# Patient Record
Sex: Male | Born: 1985 | Race: White | Hispanic: No | Marital: Single | Smoking: Former smoker
Health system: Southern US, Community
[De-identification: ages and names within clinical notes are randomized; demographics above are authoritative.]

## PROBLEM LIST (undated history)

## (undated) DIAGNOSIS — F845 Asperger's syndrome: Secondary | ICD-10-CM

## (undated) DIAGNOSIS — I519 Heart disease, unspecified: Secondary | ICD-10-CM

## (undated) DIAGNOSIS — I10 Essential (primary) hypertension: Secondary | ICD-10-CM

## (undated) DIAGNOSIS — E119 Type 2 diabetes mellitus without complications: Secondary | ICD-10-CM

## (undated) HISTORY — PX: CHOLECYSTECTOMY: SHX55

---

## 2014-06-22 ENCOUNTER — Encounter (HOSPITAL_COMMUNITY): Payer: Self-pay | Admitting: Emergency Medicine

## 2014-06-22 ENCOUNTER — Emergency Department (HOSPITAL_COMMUNITY)
Admission: EM | Admit: 2014-06-22 | Discharge: 2014-06-23 | Disposition: A | Discharge status: Home / Self Care | Payer: Medicaid Other | Attending: Emergency Medicine | Admitting: Emergency Medicine

## 2014-06-22 ENCOUNTER — Emergency Department (HOSPITAL_COMMUNITY): Payer: Medicaid Other

## 2014-06-22 DIAGNOSIS — Z720 Tobacco use: Secondary | ICD-10-CM | POA: Insufficient documentation

## 2014-06-22 DIAGNOSIS — Z792 Long term (current) use of antibiotics: Secondary | ICD-10-CM | POA: Diagnosis not present

## 2014-06-22 DIAGNOSIS — F840 Autistic disorder: Secondary | ICD-10-CM | POA: Insufficient documentation

## 2014-06-22 DIAGNOSIS — I10 Essential (primary) hypertension: Secondary | ICD-10-CM | POA: Diagnosis not present

## 2014-06-22 DIAGNOSIS — Z7982 Long term (current) use of aspirin: Secondary | ICD-10-CM | POA: Diagnosis not present

## 2014-06-22 DIAGNOSIS — Z794 Long term (current) use of insulin: Secondary | ICD-10-CM | POA: Diagnosis not present

## 2014-06-22 DIAGNOSIS — N179 Acute kidney failure, unspecified: Secondary | ICD-10-CM

## 2014-06-22 DIAGNOSIS — R079 Chest pain, unspecified: Secondary | ICD-10-CM | POA: Diagnosis not present

## 2014-06-22 DIAGNOSIS — E119 Type 2 diabetes mellitus without complications: Secondary | ICD-10-CM | POA: Diagnosis not present

## 2014-06-22 DIAGNOSIS — R000 Tachycardia, unspecified: Secondary | ICD-10-CM | POA: Diagnosis not present

## 2014-06-22 DIAGNOSIS — R202 Paresthesia of skin: Secondary | ICD-10-CM | POA: Diagnosis not present

## 2014-06-22 DIAGNOSIS — Z79899 Other long term (current) drug therapy: Secondary | ICD-10-CM | POA: Diagnosis not present

## 2014-06-22 DIAGNOSIS — I519 Heart disease, unspecified: Secondary | ICD-10-CM | POA: Diagnosis not present

## 2014-06-22 HISTORY — DX: Essential (primary) hypertension: I10

## 2014-06-22 HISTORY — DX: Asperger's syndrome: F84.5

## 2014-06-22 HISTORY — DX: Type 2 diabetes mellitus without complications: E11.9

## 2014-06-22 HISTORY — DX: Heart disease, unspecified: I51.9

## 2014-06-22 LAB — CBC
HEMATOCRIT: 35.8 % — AB (ref 39.0–52.0)
Hemoglobin: 12.5 g/dL — ABNORMAL LOW (ref 13.0–17.0)
MCH: 30 pg (ref 26.0–34.0)
MCHC: 34.9 g/dL (ref 30.0–36.0)
MCV: 86.1 fL (ref 78.0–100.0)
Platelets: 202 10*3/uL (ref 150–400)
RBC: 4.16 MIL/uL — AB (ref 4.22–5.81)
RDW: 13.4 % (ref 11.5–15.5)
WBC: 8 10*3/uL (ref 4.0–10.5)

## 2014-06-22 MED ORDER — SODIUM CHLORIDE 0.9 % IV BOLUS (SEPSIS)
1000.0000 mL | Freq: Once | INTRAVENOUS | Status: AC
  Administered 2014-06-22: 1000 mL via INTRAVENOUS

## 2014-06-22 MED ORDER — KETOROLAC TROMETHAMINE 30 MG/ML IJ SOLN
30.0000 mg | Freq: Once | INTRAMUSCULAR | Status: AC
  Administered 2014-06-22: 30 mg via INTRAVENOUS
  Filled 2014-06-22: qty 1

## 2014-06-22 NOTE — ED Provider Notes (Signed)
CSN: [id number]     Arrival date & time 06/22/14  2200 History   First MD Initiated Contact with Patient 06/22/14 2249     Chief Complaint  Patient presents with  . multiple complaints      (Consider location/radiation/quality/duration/timing/severity/associated sxs/prior Treatment) HPI Comments: The patient is a 28 year old male with a history of hypertension, diabetes and a recent cellulitis of the left side of his neck after a spider bite. He presents stating that for the last 2 weeks he has had some pain and tingling to his diffuse body specifically his legs, this is bilateral, very nonspecific and the patient does have a form of autism which makes it difficult for him to explain his symptoms. He reports that today he developed chest pain in addition to his diffuse body discomfort. He states that he may have some shortness of nausea vomiting diarrhea abdominal pain back pain headache swelling rashes. He has had no focal weakness or numbness, no visual changes, no sore throat. He does not have any risk factors for pulmonary embolism. He has been taking his medications as indicated, no new medications, no over-the-counter medications, the only new exposure with medications is the antibiotics which she has been taking without difficulty.  The history is provided by the patient and a friend.    Past Medical History  Diagnosis Date  . Hypertension   . Heart disease   . Diabetes mellitus without complication   . Asperger's syndrome    Past Surgical History  Procedure Laterality Date  . Cholecystectomy     No family history on file. History  Substance Use Topics  . Smoking status: Current Some Day Smoker  . Smokeless tobacco: Not on file  . Alcohol Use: No    Review of Systems  All other systems reviewed and are negative.     Allergies  Review of patient's allergies indicates no known allergies.  Home Medications   Prior to Admission medications   Medication Sig Start Date  End Date Taking? Authorizing Provider  aspirin EC 325 MG tablet Take 325 mg by mouth daily.   Yes Historical Provider, MD  cephALEXin (KEFLEX) 500 MG capsule Take 500 mg by mouth 4 (four) times daily.   Yes Historical Provider, MD  cetirizine (ZYRTEC) 10 MG tablet Take 10 mg by mouth daily.   Yes Historical Provider, MD  colestipol (COLESTID) 1 G tablet Take 1 g by mouth 3 (three) times daily.   Yes Historical Provider, MD  ezetimibe (ZETIA) 10 MG tablet Take 10 mg by mouth daily.   Yes Historical Provider, MD  insulin lispro (HUMALOG) 100 UNIT/ML injection Inject 30-40 Units into the skin 3 (three) times daily before meals.   Yes Historical Provider, MD  lisinopril (PRINIVIL,ZESTRIL) 40 MG tablet Take 40 mg by mouth daily.   Yes Historical Provider, MD  sulfamethoxazole-trimethoprim (BACTRIM DS,SEPTRA DS) 800-160 MG per tablet Take 1 tablet by mouth 2 (two) times daily.   Yes Historical Provider, MD  traMADol (ULTRAM) 50 MG tablet Take 1 tablet (50 mg total) by mouth every 6 (six) hours as needed. 06/23/14   Vida Roller, MD   BP 151/89 mmHg  Pulse 90  Temp(Src) 97.4 F (36.3 C) (Axillary)  Resp 15  Ht 6\' 1"  (1.854 m)  Wt 268 lb (121.564 kg)  BMI 35.37 kg/m2  SpO2 99% Physical Exam  Constitutional: He appears well-developed and well-nourished. No distress.  HENT:  Head: Normocephalic and atraumatic.  Mouth/Throat: Oropharynx is clear and moist. No  oropharyngeal exudate.  Eyes: Conjunctivae and EOM are normal. Pupils are equal, round, and reactive to light. Right eye exhibits no discharge. Left eye exhibits no discharge. No scleral icterus.  Neck: Normal range of motion. Neck supple. No JVD present. No thyromegaly present.  Cardiovascular: Regular rhythm, normal heart sounds and intact distal pulses.  Exam reveals no gallop and no friction rub.   No murmur heard. 110  Pulmonary/Chest: Effort normal and breath sounds normal. No respiratory distress. He has no wheezes. He has no rales.   Abdominal: Soft. Bowel sounds are normal. He exhibits no distension and no mass. There is no tenderness.  Musculoskeletal: Normal range of motion. He exhibits no edema or tenderness.  Lymphadenopathy:    He has no cervical adenopathy.  Neurological: He is alert. Coordination normal.  Skin: Skin is warm and dry. No rash noted. No erythema.  Psychiatric: He has a normal mood and affect. His behavior is normal.  Nursing note and vitals reviewed.   ED Course  Procedures (including critical care time) Labs Review Labs Reviewed  CBC - Abnormal; Notable for the following:    RBC 4.16 (*)    Hemoglobin 12.5 (*)    HCT 35.8 (*)    All other components within normal limits  BASIC METABOLIC PANEL - Abnormal; Notable for the following:    Sodium 134 (*)    Glucose, Bld 143 (*)    BUN 35 (*)    Creatinine, Ser 1.97 (*)    GFR calc non Af Amer 45 (*)    GFR calc Af Amer 52 (*)    All other components within normal limits  URINALYSIS, ROUTINE W REFLEX MICROSCOPIC - Abnormal; Notable for the following:    APPearance CLOUDY (*)    Glucose, UA 250 (*)    Hgb urine dipstick MODERATE (*)    Protein, ur >300 (*)    All other components within normal limits  URINE RAPID DRUG SCREEN (HOSP PERFORMED) - Abnormal; Notable for the following:    Tetrahydrocannabinol POSITIVE (*)    All other components within normal limits  D-DIMER, QUANTITATIVE - Abnormal; Notable for the following:    D-Dimer, Quant 0.56 (*)    All other components within normal limits  TROPONIN I  URINE MICROSCOPIC-ADD ON  Rosezena SensorI-STAT TROPOININ, ED    Imaging Review Dg Chest 2 View  06/22/2014   CLINICAL DATA:  Acute onset of mid chest pain and body tingling. History of diabetes and smoking. Initial encounter.  EXAM: CHEST  2 VIEW  COMPARISON:  None.  FINDINGS: The lungs are well-aerated and clear. There is no evidence of focal opacification, pleural effusion or pneumothorax.  The heart is normal in size; the mediastinal contour  is within normal limits. No acute osseous abnormalities are seen. Clips are noted within the right upper quadrant, reflecting prior cholecystectomy.  IMPRESSION: No acute cardiopulmonary process seen.   Electronically Signed   By: Roanna RaiderJeffery  Chang M.D.   On: 06/22/2014 23:43     EKG Interpretation   Date/Time:  Saturday June 23 2014 06:20:22 EST Ventricular Rate:  102 PR Interval:  163 QRS Duration: 106 QT Interval:  366 QTC Calculation: 477 R Axis:   79 Text Interpretation:  Sinus tachycardia ST elev, probable normal early  repol pattern Borderline prolonged QT interval Baseline wander in lead(s)  V3 Since last tracing rate slower Confirmed by Lonzell Dorris  MD, Glennon Kopko ([id number])  on 06/23/2014 6:24:10 AM      ED ECG REPORT  I personally  interpreted this EKG   Date: 06/23/2014   Rate: 102  Rhythm: sinus tachycardia  QRS Axis: normal  Intervals: normal  ST/T Wave abnormalities: normal  Conduction Disutrbances:none  Narrative Interpretation:   Old EKG Reviewed: unchanged   MDM   Final diagnoses:  Chest pain  Acute renal failure, unspecified acute renal failure type    There is no peripheral edema or asymmetry of the legs, the patient appears comfortable  - he has normal VS other than tachycardia.  There is a low suspicion for pulmonary embolism, pericarditis is not seen on the EKG nor does he have a friction rub him and there is no murmur, no peripheral edema, no fever. We'll start with chest x-ray, labs  The pt has renal insufficiency, unsure if this is new, the pt is unaware of prior labs and there is no prior labs in EMR.  VS have improved, fluids given and pt encouraged to maximize PO intake - repeat ECG and trop neg, stable for d/c.  [place] and f/u there.  All findings were discussed with the patient and his friend, he has expressed his understanding to the need to follow-up especially for repeat creatinine testing. He has had fluid, oral fluid,  parenteral fluid and feels much better, no acute findings on labs to explain the patient's symptoms.    Meds given in ED:  Medications  sodium chloride 0.9 % bolus 1,000 mL (0 mLs Intravenous Stopped 06/23/14 0459)  ketorolac (TORADOL) 30 MG/ML injection 30 mg (30 mg Intravenous Given 06/22/14 2332)    New Prescriptions   TRAMADOL (ULTRAM) 50 MG TABLET    Take 1 tablet (50 mg total) by mouth every 6 (six) hours as needed.      Vida RollerBrian D Shabazz Mckey, MD 06/23/14 [phone]

## 2014-06-22 NOTE — ED Notes (Addendum)
Pt reports having tingling to entire body with upper chest pain and difficulty breathing intermittent x 2 weeks. Pt states he is unable to get medical care where he is from and moved up here with cousin. Pt does have Asperger's. Pt was bit by spider to L neck, is currently on antibiotics

## 2014-06-23 LAB — BASIC METABOLIC PANEL
Anion gap: 15 (ref 5–15)
BUN: 35 mg/dL — AB (ref 6–23)
CO2: 19 meq/L (ref 19–32)
Calcium: 9.4 mg/dL (ref 8.4–10.5)
Chloride: 100 mEq/L (ref 96–112)
Creatinine, Ser: 1.97 mg/dL — ABNORMAL HIGH (ref 0.50–1.35)
GFR calc Af Amer: 52 mL/min — ABNORMAL LOW (ref >90)
GFR calc non Af Amer: 45 mL/min — ABNORMAL LOW (ref >90)
Glucose, Bld: 143 mg/dL — ABNORMAL HIGH (ref 70–99)
Potassium: 4.9 mEq/L (ref 3.7–5.3)
Sodium: 134 mEq/L — ABNORMAL LOW (ref 137–147)

## 2014-06-23 LAB — RAPID URINE DRUG SCREEN, HOSP PERFORMED
AMPHETAMINES: NOT DETECTED
BARBITURATES: NOT DETECTED
BENZODIAZEPINES: NOT DETECTED
Cocaine: NOT DETECTED
Opiates: NOT DETECTED
Tetrahydrocannabinol: POSITIVE — AB

## 2014-06-23 LAB — URINALYSIS, ROUTINE W REFLEX MICROSCOPIC
BILIRUBIN URINE: NEGATIVE
GLUCOSE, UA: 250 mg/dL — AB
Ketones, ur: NEGATIVE mg/dL
Leukocytes, UA: NEGATIVE
NITRITE: NEGATIVE
PH: 5 (ref 5.0–8.0)
Protein, ur: >300 mg/dL — AB
SPECIFIC GRAVITY, URINE: 1.027 (ref 1.005–1.030)
Urobilinogen, UA: 0.2 mg/dL (ref 0.0–1.0)

## 2014-06-23 LAB — I-STAT TROPONIN, ED: TROPONIN I, POC: 0.01 ng/mL (ref 0.00–0.08)

## 2014-06-23 LAB — URINE MICROSCOPIC-ADD ON

## 2014-06-23 LAB — D-DIMER, QUANTITATIVE: D-Dimer, Quant: 0.56 ug/mL-FEU — ABNORMAL HIGH (ref 0.00–0.48)

## 2014-06-23 LAB — TROPONIN I: Troponin I: <0.3 ng/mL (ref <0.30)

## 2014-06-23 MED ORDER — TRAMADOL HCL 50 MG PO TABS: 50.0000 mg | ORAL_TABLET | Freq: Four times a day (QID) | ORAL | Status: DC | PRN

## 2014-06-23 NOTE — Discharge Instructions (Signed)
Your testing shows that you have "kidney failure". This is mild but it absolutely must be rechecked in the next couple of days to make sure that the tests are getting better.  When you see your doctor, you may tell them that your Creatinine was just under 2.0.  I will give you a copy of your results to take to your doctor with you.    Your heart tests have been totally normal - if you should develop severe or worsening pain, return to the ER.  Please call your doctor for a followup appointment within 24-48 hours. When you talk to your doctor please let them know that you were seen in the emergency department and have them acquire all of your records so that they can discuss the findings with you and formulate a treatment plan to fully care for your new and ongoing problems.

## 2014-06-25 ENCOUNTER — Encounter (HOSPITAL_COMMUNITY): Payer: Self-pay | Admitting: Emergency Medicine

## 2014-06-25 ENCOUNTER — Emergency Department (HOSPITAL_COMMUNITY)
Admission: EM | Admit: 2014-06-25 | Discharge: 2014-06-25 | Disposition: A | Discharge status: Home / Self Care | Payer: Medicaid Other | Attending: Emergency Medicine | Admitting: Emergency Medicine

## 2014-06-25 DIAGNOSIS — Z720 Tobacco use: Secondary | ICD-10-CM | POA: Diagnosis not present

## 2014-06-25 DIAGNOSIS — Z0000 Encounter for general adult medical examination without abnormal findings: Secondary | ICD-10-CM | POA: Diagnosis present

## 2014-06-25 DIAGNOSIS — I10 Essential (primary) hypertension: Secondary | ICD-10-CM | POA: Insufficient documentation

## 2014-06-25 DIAGNOSIS — R944 Abnormal results of kidney function studies: Secondary | ICD-10-CM | POA: Diagnosis not present

## 2014-06-25 DIAGNOSIS — F845 Asperger's syndrome: Secondary | ICD-10-CM | POA: Diagnosis not present

## 2014-06-25 DIAGNOSIS — Z87448 Personal history of other diseases of urinary system: Secondary | ICD-10-CM | POA: Insufficient documentation

## 2014-06-25 DIAGNOSIS — Z79899 Other long term (current) drug therapy: Secondary | ICD-10-CM | POA: Diagnosis not present

## 2014-06-25 DIAGNOSIS — Z794 Long term (current) use of insulin: Secondary | ICD-10-CM | POA: Insufficient documentation

## 2014-06-25 DIAGNOSIS — Z792 Long term (current) use of antibiotics: Secondary | ICD-10-CM | POA: Diagnosis not present

## 2014-06-25 DIAGNOSIS — N289 Disorder of kidney and ureter, unspecified: Secondary | ICD-10-CM

## 2014-06-25 DIAGNOSIS — E119 Type 2 diabetes mellitus without complications: Secondary | ICD-10-CM | POA: Diagnosis not present

## 2014-06-25 DIAGNOSIS — Z7982 Long term (current) use of aspirin: Secondary | ICD-10-CM | POA: Insufficient documentation

## 2014-06-25 DIAGNOSIS — Z8639 Personal history of other endocrine, nutritional and metabolic disease: Secondary | ICD-10-CM

## 2014-06-25 LAB — I-STAT CHEM 8, ED
BUN: 25 mg/dL — ABNORMAL HIGH (ref 6–23)
CALCIUM ION: 1.22 mmol/L (ref 1.12–1.23)
CHLORIDE: 107 meq/L (ref 96–112)
Creatinine, Ser: 1.6 mg/dL — ABNORMAL HIGH (ref 0.50–1.35)
Glucose, Bld: 165 mg/dL — ABNORMAL HIGH (ref 70–99)
HEMATOCRIT: 35 % — AB (ref 39.0–52.0)
Hemoglobin: 11.9 g/dL — ABNORMAL LOW (ref 13.0–17.0)
Potassium: 5.3 mEq/L (ref 3.7–5.3)
Sodium: 136 mEq/L — ABNORMAL LOW (ref 137–147)
TCO2: 21 mmol/L (ref 0–100)

## 2014-06-25 NOTE — ED Notes (Signed)
Pt was seen here Friday , creatinine was high and pt was told to recheck it within 3 days.

## 2014-06-25 NOTE — ED Provider Notes (Signed)
CSN: [id number]     Arrival date & time 06/25/14  1423 History   First MD Initiated Contact with Patient 06/25/14 1436     Chief Complaint  Patient presents with  . Labs Only     (Consider location/radiation/quality/duration/timing/severity/associated sxs/prior Treatment) HPI  Jonathan Baxter is a 28 y.o. male with PMH of hypertension, diabetes, Asperger's, anxiety who was evaluated here 3 days ago for nonspecific generalized body aches including chest pain. EKG, chest x-ray, troponins were negative. Patient has a form of autism and it makes it difficult for him to explain his symptoms. His caretaker is in the exam room with him and answers most questions. Patient's complaint today is recheck of his creatinine and kidney function. Pt with unexplained acute kidney injury 3 days ago without prior labs to compare to. Patient denies any chest pain, shortness of breath, nausea, vomiting, abdominal pain, headaches, back pain. No other complaints today other than anxiety being at the hospital. Patient unable to follow[place] in [place]. He has not had a provider here.   Past Medical History  Diagnosis Date  . Hypertension   . Heart disease   . Diabetes mellitus without complication   . Asperger's syndrome    Past Surgical History  Procedure Laterality Date  . Cholecystectomy     No family history on file. History  Substance Use Topics  . Smoking status: Current Some Day Smoker  . Smokeless tobacco: Not on file  . Alcohol Use: No    Review of Systems  Constitutional: Negative for fever and chills.  HENT: Negative for congestion and rhinorrhea.   Eyes: Negative for visual disturbance.  Respiratory: Negative for cough and shortness of breath.   Cardiovascular: Negative for chest pain and palpitations.  Gastrointestinal: Negative for nausea, vomiting and diarrhea.  Genitourinary: Negative for dysuria and hematuria.  Musculoskeletal: Negative for back pain and  gait problem.  Skin: Negative for rash.  Neurological: Negative for weakness and headaches.      Allergies  Review of patient's allergies indicates no known allergies.  Home Medications   Prior to Admission medications   Medication Sig Start Date End Date Taking? Authorizing Provider  aspirin EC 325 MG tablet Take 325 mg by mouth daily.    Historical Provider, MD  cephALEXin (KEFLEX) 500 MG capsule Take 500 mg by mouth 4 (four) times daily.    Historical Provider, MD  cetirizine (ZYRTEC) 10 MG tablet Take 10 mg by mouth daily.    Historical Provider, MD  colestipol (COLESTID) 1 G tablet Take 1 g by mouth 3 (three) times daily.    Historical Provider, MD  ezetimibe (ZETIA) 10 MG tablet Take 10 mg by mouth daily.    Historical Provider, MD  insulin lispro (HUMALOG) 100 UNIT/ML injection Inject 30-40 Units into the skin 3 (three) times daily before meals.    Historical Provider, MD  lisinopril (PRINIVIL,ZESTRIL) 40 MG tablet Take 40 mg by mouth daily.    Historical Provider, MD  sulfamethoxazole-trimethoprim (BACTRIM DS,SEPTRA DS) 800-160 MG per tablet Take 1 tablet by mouth 2 (two) times daily.    Historical Provider, MD  traMADol (ULTRAM) 50 MG tablet Take 1 tablet (50 mg total) by mouth every 6 (six) hours as needed. 06/23/14   Vida RollerBrian D Miller, MD   BP 133/73 mmHg  Pulse 116  Temp(Src) 98 F (36.7 C)  Resp 18  SpO2 100% Physical Exam  Constitutional: He appears well-developed and well-nourished. No distress.  HENT:  Head: Normocephalic and atraumatic.  Eyes: Conjunctivae and EOM are normal. Right eye exhibits no discharge. Left eye exhibits no discharge.  Cardiovascular: Normal rate, regular rhythm and normal heart sounds.   Pulmonary/Chest: Effort normal and breath sounds normal. No respiratory distress. He has no wheezes.  Abdominal: Soft. Bowel sounds are normal. He exhibits no distension. There is no tenderness.  Neurological: He is alert. He exhibits normal muscle tone.  Coordination normal.  Skin: Skin is warm and dry. He is not diaphoretic.  Nursing note and vitals reviewed.   ED Course  Procedures (including critical care time) Labs Review Labs Reviewed  I-STAT CHEM 8, ED - Abnormal; Notable for the following:    Sodium 136 (*)    BUN 25 (*)    Creatinine, Ser 1.60 (*)    Glucose, Bld 165 (*)    Hemoglobin 11.9 (*)    HCT 35.0 (*)    All other components within normal limits    Imaging Review No results found.   EKG Interpretation None      MDM   Final diagnoses:  Abnormal kidney function  History of diabetes mellitus, type II   Pt with history of acute kidney injury who presents today for recheck of kidney function and was unable to follow up with a PCP in the area due to [place]CP in [place] and new PCP not accepting his insurance without medicaid accepting PCP transfer. Repeat of kidney function shows improvement of creatine 1.60 from 1.9 and BUN 25 today from 35. Still unsure if this is pts baseline. Pt and caregiver provided with referral to nephrology for further evaluation of kidney function. If patient unable to follow up with nephrology in timely manner, pt to present to PCP or ED in one week for reevaluation of kidney function. Pt referred to the wellness center for PCP follow up. Pt without symptoms today except for anxiety and tachycardia which improved on repeat examination after pt had watched videos that care provider had brought with her. Pt with tachycardia on previous presentation and with negative d-dimer. Pt low risk for and I doubt PE.   Discussed return precautions with patient. Discussed all results and patient verbalizes understanding and agrees with plan.      Louann SjogrenVictoria L Dimarco Minkin, PA-C 06/25/14 2017  Elwin MochaBlair Walden, MD 06/26/14 [id number]

## 2014-06-25 NOTE — Discharge Instructions (Signed)
Return to the emergency room with worsening of symptoms, new symptoms or with symptoms that are concerning. Call to make appointment with nephrology. If you were unable to follow-up with nephrology please present back to the ED in 1 week for recheck of kidney function. [facility] to [facility] care with a primary care provider. Be sure to drink plenty of fluids.

## 2014-07-03 ENCOUNTER — Emergency Department (HOSPITAL_COMMUNITY): Payer: Medicaid Other

## 2014-07-03 ENCOUNTER — Encounter (HOSPITAL_COMMUNITY): Payer: Self-pay

## 2014-07-03 ENCOUNTER — Emergency Department (HOSPITAL_COMMUNITY)
Admission: EM | Admit: 2014-07-03 | Discharge: 2014-07-03 | Disposition: A | Discharge status: Home / Self Care | Payer: Medicaid Other | Attending: Emergency Medicine | Admitting: Emergency Medicine

## 2014-07-03 DIAGNOSIS — E119 Type 2 diabetes mellitus without complications: Secondary | ICD-10-CM | POA: Insufficient documentation

## 2014-07-03 DIAGNOSIS — I10 Essential (primary) hypertension: Secondary | ICD-10-CM | POA: Insufficient documentation

## 2014-07-03 DIAGNOSIS — R0789 Other chest pain: Secondary | ICD-10-CM | POA: Insufficient documentation

## 2014-07-03 DIAGNOSIS — Z720 Tobacco use: Secondary | ICD-10-CM | POA: Diagnosis not present

## 2014-07-03 DIAGNOSIS — Z794 Long term (current) use of insulin: Secondary | ICD-10-CM | POA: Diagnosis not present

## 2014-07-03 DIAGNOSIS — R079 Chest pain, unspecified: Secondary | ICD-10-CM | POA: Diagnosis present

## 2014-07-03 DIAGNOSIS — Z7982 Long term (current) use of aspirin: Secondary | ICD-10-CM | POA: Diagnosis not present

## 2014-07-03 DIAGNOSIS — Z79899 Other long term (current) drug therapy: Secondary | ICD-10-CM | POA: Diagnosis not present

## 2014-07-03 DIAGNOSIS — Z8659 Personal history of other mental and behavioral disorders: Secondary | ICD-10-CM | POA: Insufficient documentation

## 2014-07-03 LAB — CBC
HCT: 30.1 % — ABNORMAL LOW (ref 39.0–52.0)
Hemoglobin: 10.3 g/dL — ABNORMAL LOW (ref 13.0–17.0)
MCH: 28.9 pg (ref 26.0–34.0)
MCHC: 34.2 g/dL (ref 30.0–36.0)
MCV: 84.6 fL (ref 78.0–100.0)
PLATELETS: 165 10*3/uL (ref 150–400)
RBC: 3.56 MIL/uL — ABNORMAL LOW (ref 4.22–5.81)
RDW: 13.4 % (ref 11.5–15.5)
WBC: 5.5 10*3/uL (ref 4.0–10.5)

## 2014-07-03 LAB — BASIC METABOLIC PANEL
Anion gap: 15 (ref 5–15)
BUN: 22 mg/dL (ref 6–23)
CHLORIDE: 99 meq/L (ref 96–112)
CO2: 22 mEq/L (ref 19–32)
CREATININE: 1.15 mg/dL (ref 0.50–1.35)
Calcium: 9.3 mg/dL (ref 8.4–10.5)
GFR calc non Af Amer: 86 mL/min — ABNORMAL LOW (ref >90)
Glucose, Bld: 202 mg/dL — ABNORMAL HIGH (ref 70–99)
Potassium: 4.2 mEq/L (ref 3.7–5.3)
SODIUM: 136 meq/L — AB (ref 137–147)

## 2014-07-03 LAB — I-STAT TROPONIN, ED: Troponin i, poc: 0 ng/mL (ref 0.00–0.08)

## 2014-07-03 MED ORDER — TRAMADOL HCL 50 MG PO TABS: ORAL_TABLET | ORAL | Status: AC

## 2014-07-03 NOTE — Discharge Instructions (Signed)
Follow up with a family md in 1 week or sooner

## 2014-07-03 NOTE — ED Notes (Signed)
Pt c/o intermittent center chest pain and intermittent numbness/tingling in hands and feet x 1-2 and needs renal levels checked.  Pain score 2/10.  Pt has been seen previously for same, but symptoms continue.  Pt reports symptoms increase at night.

## 2014-07-03 NOTE — ED Provider Notes (Signed)
CSN: [id number]     Arrival date & time 07/03/14  1701 History   First MD Initiated Contact with Patient 07/03/14 1804     Chief Complaint  Patient presents with  . Chest Pain  . Numbness     (Consider location/radiation/quality/duration/timing/severity/associated sxs/prior Treatment) Patient is a 28 y.o. male presenting with chest pain. The history is provided by the patient (the pt complains of chest pain off and on.   2 neg work ups).  Chest Pain Pain location:  L chest Pain quality: aching   Pain radiates to:  Does not radiate Pain radiates to the back: no   Pain severity:  Mild Onset quality:  Gradual Timing:  Intermittent Chronicity:  New Context: not breathing   Associated symptoms: no abdominal pain, no back pain, no cough, no fatigue and no headache     Past Medical History  Diagnosis Date  . Hypertension   . Heart disease   . Diabetes mellitus without complication   . Asperger's syndrome    Past Surgical History  Procedure Laterality Date  . Cholecystectomy     History reviewed. No pertinent family history. History  Substance Use Topics  . Smoking status: Current Some Day Smoker  . Smokeless tobacco: Not on file  . Alcohol Use: No    Review of Systems  Constitutional: Negative for appetite change and fatigue.  HENT: Negative for congestion, ear discharge and sinus pressure.   Eyes: Negative for discharge.  Respiratory: Negative for cough.   Cardiovascular: Positive for chest pain.  Gastrointestinal: Negative for abdominal pain and diarrhea.  Genitourinary: Negative for frequency and hematuria.  Musculoskeletal: Negative for back pain.  Skin: Negative for rash.  Neurological: Negative for seizures and headaches.  Psychiatric/Behavioral: Negative for hallucinations.      Allergies  Review of patient's allergies indicates no known allergies.  Home Medications   Prior to Admission medications   Medication Sig Start Date End Date Taking?  Authorizing Provider  aspirin EC 325 MG tablet Take 325 mg by mouth daily.   Yes Historical Provider, MD  cetirizine (ZYRTEC) 10 MG tablet Take 10 mg by mouth daily.   Yes Historical Provider, MD  colestipol (COLESTID) 1 G tablet Take 1 g by mouth 3 (three) times daily.   Yes Historical Provider, MD  ezetimibe (ZETIA) 10 MG tablet Take 10 mg by mouth daily.   Yes Historical Provider, MD  ibuprofen (ADVIL,MOTRIN) 200 MG tablet Take 600 mg by mouth every 6 (six) hours as needed for moderate pain (headache).   Yes Historical Provider, MD  insulin lispro (HUMALOG) 100 UNIT/ML injection Inject 40-60 Units into the skin 2 (two) times daily.    Yes Historical Provider, MD  lisinopril (PRINIVIL,ZESTRIL) 40 MG tablet Take 40 mg by mouth daily.   Yes Historical Provider, MD  traMADol (ULTRAM) 50 MG tablet Take one every 8 hours for pain not helped by tylenol 07/03/14   Benny LennertJoseph L Blayne Frankie, MD   BP 153/101 mmHg  Pulse 111  Temp(Src) 97.7 F (36.5 C) (Oral)  Resp 20  SpO2 96% Physical Exam  Constitutional: He is oriented to person, place, and time. He appears well-developed.  HENT:  Head: Normocephalic.  Eyes: Conjunctivae and EOM are normal. No scleral icterus.  Neck: Neck supple. No thyromegaly present.  Cardiovascular: Normal rate and regular rhythm.  Exam reveals no gallop and no friction rub.   No murmur heard. Pulmonary/Chest: No stridor. He has no wheezes. He has no rales. He exhibits no tenderness.  Abdominal: He exhibits no distension. There is no tenderness. There is no rebound.  Musculoskeletal: Normal range of motion. He exhibits no edema.  Lymphadenopathy:    He has no cervical adenopathy.  Neurological: He is oriented to person, place, and time. He exhibits normal muscle tone. Coordination normal.  Skin: No rash noted. No erythema.  Psychiatric: He has a normal mood and affect. His behavior is normal.    ED Course  Procedures (including critical care time) Labs Review Labs Reviewed   CBC - Abnormal; Notable for the following:    RBC 3.56 (*)    Hemoglobin 10.3 (*)    HCT 30.1 (*)    All other components within normal limits  BASIC METABOLIC PANEL - Abnormal; Notable for the following:    Sodium 136 (*)    Glucose, Bld 202 (*)    GFR calc non Af Amer 86 (*)    All other components within normal limits  Rosezena SensorI-STAT TROPOININ, ED    Imaging Review Dg Chest 2 View  07/03/2014   CLINICAL DATA:  Chest pain.  EXAM: CHEST  2 VIEW  COMPARISON:  06/22/2014  FINDINGS: The heart size and mediastinal contours are within normal limits. Both lungs are clear. The visualized skeletal structures are unremarkable.  IMPRESSION: No active cardiopulmonary disease.   Electronically Signed   By: Loralie ChampagneMark  Gallerani M.D.   On: 07/03/2014 17:45     EKG Interpretation   Date/Time:  Tuesday July 03 2014 17:19:29 EST Ventricular Rate:  109 PR Interval:  153 QRS Duration: 104 QT Interval:  369 QTC Calculation: 497 R Axis:   66 Text Interpretation:  Sinus tachycardia Prolonged QT interval Confirmed by  Cambri Plourde  MD, Shomari Matusik [phone]([id number]) on 07/03/2014 7:11:07 PM      MDM   Final diagnoses:  Other chest pain    Non cardiac chest pain.   Pt to follow up with family md    Benny LennertJoseph L Trisa Cranor, MD 07/03/14 1946

## 2014-07-11 ENCOUNTER — Ambulatory Visit (HOSPITAL_BASED_OUTPATIENT_CLINIC_OR_DEPARTMENT_OTHER): Payer: Medicaid Other | Admitting: *Deleted

## 2014-07-11 ENCOUNTER — Ambulatory Visit: Payer: Medicaid Other | Attending: Internal Medicine | Admitting: Internal Medicine

## 2014-07-11 ENCOUNTER — Encounter: Payer: Self-pay | Admitting: Internal Medicine

## 2014-07-11 VITALS — BP 138/95 | HR 113 | Temp 98.0°F | Resp 16 | Ht 74.0 in | Wt 267.0 lb

## 2014-07-11 DIAGNOSIS — F845 Asperger's syndrome: Secondary | ICD-10-CM

## 2014-07-11 DIAGNOSIS — Z23 Encounter for immunization: Secondary | ICD-10-CM

## 2014-07-11 DIAGNOSIS — E119 Type 2 diabetes mellitus without complications: Secondary | ICD-10-CM

## 2014-07-11 DIAGNOSIS — I10 Essential (primary) hypertension: Secondary | ICD-10-CM | POA: Diagnosis not present

## 2014-07-11 DIAGNOSIS — F329 Major depressive disorder, single episode, unspecified: Secondary | ICD-10-CM | POA: Diagnosis not present

## 2014-07-11 DIAGNOSIS — F32A Depression, unspecified: Secondary | ICD-10-CM

## 2014-07-11 LAB — GLUCOSE, POCT (MANUAL RESULT ENTRY): POC Glucose: 214 mg/dl — AB (ref 70–99)

## 2014-07-11 LAB — POCT GLYCOSYLATED HEMOGLOBIN (HGB A1C): Hemoglobin A1C: 8

## 2014-07-11 MED ORDER — INSULIN GLARGINE 100 UNIT/ML ~~LOC~~ SOLN: 70.0000 [IU] | Freq: Two times a day (BID) | SUBCUTANEOUS | Status: AC

## 2014-07-11 MED ORDER — GLUCOSE BLOOD VI STRP: ORAL_STRIP | Status: AC

## 2014-07-11 MED ORDER — INSULIN LISPRO 100 UNIT/ML ~~LOC~~ SOLN: 2.0000 [IU] | Freq: Three times a day (TID) | SUBCUTANEOUS | Status: AC

## 2014-07-11 NOTE — Progress Notes (Signed)
Patient ID: Jonathan Baxter, male   DOB: 26-Apr-1986, 28 y.o.   MRN: [id number]  [id number]  [id number]  DOB - 26-Apr-1986  CC:  Chief Complaint  Patient presents with  . Establish Care       HPI: Jonathan Baxter is a 28 y.o. male with PMH of hypertension, diabetes, Asperger's syndrome, and anxiety who was evaluated 3 weeks ago in the ER for nonspecific generalized body aches including chest pain. EKG, chest x-ray, troponins were all negative. Patient has a form of autism and it makes it difficult for him to explain his symptoms.  Today he presents for management of his DM and hypertension.  He currently checks blood sugar 3-4 times per day with fasting sugars of 91-206.  He currently takes humalog 2-6 units TID with meals per sliding scale and Lantus 70 units BID.   He is currently living with his cousin who is his caregiver and makes sure he takes his medication correctly.  He reports that tries to follow a strict diabetic diet. He takes Lisinopril 40 mg once daily without skipped doses. He denies etoh, tobacco, and illicit drug use  Patient has No headache, No chest pain, No abdominal pain - No Nausea, No new weakness tingling or numbness, No Cough - SOB.  No Known Allergies Past Medical History  Diagnosis Date  . Hypertension   . Heart disease   . Diabetes mellitus without complication   . Asperger's syndrome    Current Outpatient Prescriptions on File Prior to Visit  Medication Sig Dispense Refill  . aspirin EC 325 MG tablet Take 325 mg by mouth daily.    . cetirizine (ZYRTEC) 10 MG tablet Take 10 mg by mouth daily.    . colestipol (COLESTID) 1 G tablet Take 1 g by mouth 3 (three) times daily.    Marland Kitchen. ezetimibe (ZETIA) 10 MG tablet Take 10 mg by mouth daily.    Marland Kitchen. ibuprofen (ADVIL,MOTRIN) 200 MG tablet Take 600 mg by mouth every 6 (six) hours as needed for moderate pain (headache).    . insulin lispro (HUMALOG) 100 UNIT/ML injection Inject 40-60 Units into the skin 2 (two) times daily.      Marland Kitchen. lisinopril (PRINIVIL,ZESTRIL) 40 MG tablet Take 40 mg by mouth daily.    . traMADol (ULTRAM) 50 MG tablet Take one every 8 hours for pain not helped by tylenol 20 tablet 0   No current facility-administered medications on file prior to visit.   Family History  Problem Relation Age of Onset  . Arthritis Mother   . Diabetes Father   . Diabetes Brother    History   Social History  . Marital Status: Single    Spouse Name: N/A    Number of Children: N/A  . Years of Education: N/A   Occupational History  . Not on file.   Social History Main Topics  . Smoking status: Current Some Day Smoker  . Smokeless tobacco: Not on file  . Alcohol Use: No  . Drug Use: Yes    Special: Marijuana     Comment: unable to state last usage  . Sexual Activity: Not on file   Other Topics Concern  . Not on file   Social History Narrative    Review of Systems: See HPI   Objective:   Filed Vitals:   07/11/14 1432  BP: 138/95  Pulse: 113  Temp: 98 F (36.7 C)  Resp: 16    Physical Exam: Constitutional: Patient appears well-developed and well-nourished. No distress.  HENT: Normocephalic, atraumatic, External right and left ear normal. Oropharynx is clear and moist.  Eyes: Conjunctivae and EOM are normal. PERRLA, no scleral icterus. CVS: RRR, S1/S2 +, no murmurs, no gallops, no carotid bruit.  Pulmonary: Effort and breath sounds normal, no stridor, rhonchi, wheezes, rales.  Abdominal: Soft. BS +, no distension, tenderness, rebound or guarding.  Musculoskeletal: Normal range of motion. No edema and no tenderness.  Neuro: Alert Skin: Skin is warm and dry. No rash noted. Not diaphoretic. No erythema. No pallor. Psychiatric: Normal mood and affect. Behavior, judgment, thought content normal.  Lab Results  Component Value Date   WBC 5.5 07/03/2014   HGB 10.3* 07/03/2014   HCT 30.1* 07/03/2014   MCV 84.6 07/03/2014   PLT 165 07/03/2014   Lab Results  Component Value Date    CREATININE 1.15 07/03/2014   BUN 22 07/03/2014   NA 136* 07/03/2014   K 4.2 07/03/2014   CL 99 07/03/2014   CO2 22 07/03/2014    No results found for: HGBA1C Lipid Panel  No results found for: CHOL, TRIG, HDL, CHOLHDL, VLDL, LDLCALC     Assessment and plan:   Jonathan was seen today for establish care.  Diagnoses and associated orders for this visit:  Type 2 diabetes mellitus without complication - Glucose (CBG) - HgB A1c - Lipid panel; Future - TSH; Future - Continue insulin glargine (LANTUS) 100 UNIT/ML injection; Inject 0.7 mLs (70 Units total) into the skin 2 (two) times daily. E11.65 - Continue insulin lispro (HUMALOG) 100 UNIT/ML injection; Inject 0.02-0.06 mLs (2-6 Units total) into the skin 3 (three) times daily with meals. E11.65 - glucose blood test strip; Use as instructed Patients diabetes is well control as evidence by low a1c.  Patient will continue with current therapy and continue to make necessary lifestyle changes.  Reviewed foot care, diet, exercise, annual health maintenance with patient.   Essential hypertension Patient blood pressure is stable and may continue on current medication.  Education on diet, exercise, and modifiable risk factors discussed. Will obtain appropriate labs as needed. Will follow up in 3 months.   Asperger's syndrome - Ambulatory referral to Psychology  Depression - Ambulatory referral to Psychiatry  Need for influenza vaccination Influenza injection received.  Explained side effects and contraindications to patient. Information sheet given to patient.   Return for 2-3 weeks lab and 3 mo PCP.    Holland CommonsKECK, Athena Baltz, NP-C [facility] [phone] 07/11/2014, 2:37 PM

## 2014-07-11 NOTE — Patient Instructions (Addendum)
If Blood sugar is 120-150, give 1 unit 151-200, give 2 units 201-250, give 4 units 251-300, give 6 units 301-350, give 8 units >350 call office      DASH Eating Plan DASH stands for "Dietary Approaches to Stop Hypertension." The DASH eating plan is a healthy eating plan that has been shown to reduce high blood pressure (hypertension). Additional health benefits may include reducing the risk of type 2 diabetes mellitus, heart disease, and stroke. The DASH eating plan may also help with weight loss. WHAT DO I NEED TO KNOW ABOUT THE DASH EATING PLAN? For the DASH eating plan, you will follow these general guidelines:  Choose foods with a percent daily value for sodium of less than 5% (as listed on the food label).  Use salt-free seasonings or herbs instead of table salt or sea salt.  Check with your health care provider or pharmacist before using salt substitutes.  Eat lower-sodium products, often labeled as "lower sodium" or "no salt added."  Eat fresh foods.  Eat more vegetables, fruits, and low-fat dairy products.  Choose whole grains. Look for the word "whole" as the first word in the ingredient list.  Choose fish and skinless chicken or Malaw[place]urkey more often than red meat. Limit fish, poultry, and meat to 6 oz (170 g) each day.  Limit sweets, desserts, sugars, and sugary drinks.  Choose heart-healthy fats.  Limit cheese to 1 oz (28 g) per day.  Eat more home-cooked food and less restaurant, buffet, and fast food.  Limit fried foods.  Cook foods using methods other than frying.  Limit canned vegetables. If you do use them, rinse them well to decrease the sodium.  When eating at a restaurant, ask that your food be prepared with less salt, or no salt if possible. WHAT FOODS CAN I EAT? Seek help from a dietitian for individual calorie needs. Grains Whole grain or whole wheat bread. Brown rice. Whole grain or whole wheat pasta. Quinoa, bulgur, and whole grain cereals.  Low-sodium cereals. Corn or whole wheat flour tortillas. Whole grain cornbread. Whole grain crackers. Low-sodium crackers. Vegetables Fresh or frozen vegetables (raw, steamed, roasted, or grilled). Low-sodium or reduced-sodium tomato and vegetable juices. Low-sodium or reduced-sodium tomato sauce and paste. Low-sodium or reduced-sodium canned vegetables.  Fruits All fresh, canned (in natural juice), or frozen fruits. Meat and Other Protein Products Ground beef (85% or leaner), grass-fed beef, or beef trimmed of fat. Skinless chicken or [place]urkey. Ground chicken or [place]urkey. Pork trimmed of fat. All fish and seafood. Eggs. Dried beans, peas, or lentils. Unsalted nuts and seeds. Unsalted canned beans. Dairy Low-fat dairy products, such as skim or 1% milk, 2% or reduced-fat cheeses, low-fat ricotta or cottage cheese, or plain low-fat yogurt. Low-sodium or reduced-sodium cheeses. Fats and Oils Tub margarines without trans fats. Light or reduced-fat mayonnaise and salad dressings (reduced sodium). Avocado. Safflower, olive, or canola oils. Natural peanut or almond butter. Other Unsalted popcorn and pretzels. The items listed above may not be a complete list of recommended foods or beverages. Contact your dietitian for more options. WHAT FOODS ARE NOT RECOMMENDED? Grains White bread. White pasta. White rice. Refined cornbread. Bagels and croissants. Crackers that contain trans fat. Vegetables Creamed or fried vegetables. Vegetables in a cheese sauce. Regular canned vegetables. Regular canned tomato sauce and paste. Regular tomato and vegetable juices. Fruits Dried fruits. Canned fruit in light or heavy syrup. Fruit juice. Meat and Other Protein Products Fatty cuts of meat. Ribs, chicken wings, bacon, sausage, bologna, salami,  chitterlings, fatback, hot dogs, bratwurst, and packaged luncheon meats. Salted nuts and seeds. Canned beans with salt. Dairy Whole or 2% milk, cream, half-and-half, and cream  cheese. Whole-fat or sweetened yogurt. Full-fat cheeses or blue cheese. Nondairy creamers and whipped toppings. Processed cheese, cheese spreads, or cheese curds. Condiments Onion and garlic salt, seasoned salt, table salt, and sea salt. Canned and packaged gravies. Worcestershire sauce. Tartar sauce. Barbecue sauce. Teriyaki sauce. Soy sauce, including reduced sodium. Steak sauce. Fish sauce. Oyster sauce. Cocktail sauce. Horseradish. Ketchup and mustard. Meat flavorings and tenderizers. Bouillon cubes. Hot sauce. Tabasco sauce. Marinades. Taco seasonings. Relishes. Fats and Oils Butter, stick margarine, lard, shortening, ghee, and bacon fat. Coconut, palm kernel, or palm oils. Regular salad dressings. Other Pickles and olives. Salted popcorn and pretzels. The items listed above may not be a complete list of foods and beverages to avoid. Contact your dietitian for more information. WHERE CAN I FIND MORE INFORMATION? National Heart, Lung, and Blood Institute: travelstabloid.com Document Released: 07/02/2011 Document Revised: 11/27/2013 Document Reviewed: 05/17/2013 [facility] Patient Information 2015 [place], [place]. This information is not intended to replace advice given to you by your health care provider. Make sure you discuss any questions you have with your health care provider.

## 2014-07-11 NOTE — Progress Notes (Signed)
Pt is here to establish care. Pt has diabetes and needs management. Pt was in the ED recently and his blood work showed elevated kidney. Pt needs a referral to mental health.

## 2014-07-30 ENCOUNTER — Ambulatory Visit: Payer: Medicaid Other | Attending: Internal Medicine

## 2014-07-30 ENCOUNTER — Emergency Department (HOSPITAL_COMMUNITY)
Admission: EM | Admit: 2014-07-30 | Discharge: 2014-07-30 | Disposition: A | Discharge status: Home / Self Care | Payer: 59 | Attending: Emergency Medicine | Admitting: Emergency Medicine

## 2014-07-30 ENCOUNTER — Encounter (HOSPITAL_COMMUNITY): Payer: Self-pay | Admitting: Emergency Medicine

## 2014-07-30 DIAGNOSIS — Z87891 Personal history of nicotine dependence: Secondary | ICD-10-CM | POA: Diagnosis not present

## 2014-07-30 DIAGNOSIS — Z794 Long term (current) use of insulin: Secondary | ICD-10-CM | POA: Insufficient documentation

## 2014-07-30 DIAGNOSIS — F329 Major depressive disorder, single episode, unspecified: Secondary | ICD-10-CM | POA: Insufficient documentation

## 2014-07-30 DIAGNOSIS — E119 Type 2 diabetes mellitus without complications: Secondary | ICD-10-CM

## 2014-07-30 DIAGNOSIS — I10 Essential (primary) hypertension: Secondary | ICD-10-CM | POA: Insufficient documentation

## 2014-07-30 DIAGNOSIS — Z046 Encounter for general psychiatric examination, requested by authority: Secondary | ICD-10-CM | POA: Diagnosis present

## 2014-07-30 DIAGNOSIS — Z7982 Long term (current) use of aspirin: Secondary | ICD-10-CM | POA: Diagnosis not present

## 2014-07-30 DIAGNOSIS — Z79899 Other long term (current) drug therapy: Secondary | ICD-10-CM | POA: Insufficient documentation

## 2014-07-30 DIAGNOSIS — F419 Anxiety disorder, unspecified: Secondary | ICD-10-CM | POA: Insufficient documentation

## 2014-07-30 DIAGNOSIS — F32A Depression, unspecified: Secondary | ICD-10-CM

## 2014-07-30 LAB — CBC WITH DIFFERENTIAL/PLATELET
Basophils Absolute: 0 10*3/uL (ref 0.0–0.1)
Basophils Relative: 1 % (ref 0–1)
Eosinophils Absolute: 0.1 10*3/uL (ref 0.0–0.7)
Eosinophils Relative: 2 % (ref 0–5)
HCT: 37.1 % — ABNORMAL LOW (ref 39.0–52.0)
Hemoglobin: 12.4 g/dL — ABNORMAL LOW (ref 13.0–17.0)
Lymphocytes Relative: 31 % (ref 12–46)
Lymphs Abs: 1.9 10*3/uL (ref 0.7–4.0)
MCH: 29 pg (ref 26.0–34.0)
MCHC: 33.4 g/dL (ref 30.0–36.0)
MCV: 86.7 fL (ref 78.0–100.0)
Monocytes Absolute: 0.3 10*3/uL (ref 0.1–1.0)
Monocytes Relative: 5 % (ref 3–12)
Neutro Abs: 3.8 10*3/uL (ref 1.7–7.7)
Neutrophils Relative %: 61 % (ref 43–77)
Platelets: 166 10*3/uL (ref 150–400)
RBC: 4.28 MIL/uL (ref 4.22–5.81)
RDW: 13.8 % (ref 11.5–15.5)
WBC: 6.1 10*3/uL (ref 4.0–10.5)

## 2014-07-30 LAB — LIPID PANEL
CHOL/HDL RATIO: 5.3 ratio
Cholesterol: 175 mg/dL (ref 0–200)
HDL: 33 mg/dL — AB (ref >39)
Triglycerides: 677 mg/dL — ABNORMAL HIGH (ref <150)

## 2014-07-30 LAB — URINALYSIS, ROUTINE W REFLEX MICROSCOPIC
Bilirubin Urine: NEGATIVE
Glucose, UA: 100 mg/dL — AB
Ketones, ur: NEGATIVE mg/dL
Leukocytes, UA: NEGATIVE
Nitrite: NEGATIVE
Protein, ur: >300 mg/dL — AB
Specific Gravity, Urine: 1.025 (ref 1.005–1.030)
Urobilinogen, UA: 0.2 mg/dL (ref 0.0–1.0)
pH: 6 (ref 5.0–8.0)

## 2014-07-30 LAB — RAPID URINE DRUG SCREEN, HOSP PERFORMED
Amphetamines: NOT DETECTED
Barbiturates: NOT DETECTED
Benzodiazepines: NOT DETECTED
Cocaine: NOT DETECTED
Opiates: NOT DETECTED
Tetrahydrocannabinol: NOT DETECTED

## 2014-07-30 LAB — BASIC METABOLIC PANEL
Anion gap: 9 (ref 5–15)
BUN: 17 mg/dL (ref 6–23)
CO2: 26 mmol/L (ref 19–32)
Calcium: 8.8 mg/dL (ref 8.4–10.5)
Chloride: 103 mEq/L (ref 96–112)
Creatinine, Ser: 1.11 mg/dL (ref 0.50–1.35)
GFR calc Af Amer: >90 mL/min (ref >90)
GFR calc non Af Amer: 90 mL/min — ABNORMAL LOW (ref >90)
Glucose, Bld: 228 mg/dL — ABNORMAL HIGH (ref 70–99)
Potassium: 4.2 mmol/L (ref 3.5–5.1)
Sodium: 138 mmol/L (ref 135–145)

## 2014-07-30 LAB — URINE MICROSCOPIC-ADD ON

## 2014-07-30 LAB — TSH: TSH: 1.837 u[IU]/mL (ref 0.350–4.500)

## 2014-07-30 NOTE — ED Notes (Signed)
MD at bedside. 

## 2014-07-30 NOTE — BH Assessment (Signed)
[facility] Assessment Progress Note   Called to schedule tele assessment with pt as well as gathered clinical information from EDP Kohut @ 1215.  This clinician to complete tele assessment.  Casimer Lanius, MS, [facility] Licensed Professional Counselor Therapeutic Triage Specialist [facility] Phone: [phone] Fax: [phone]
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
## 2014-07-30 NOTE — BH Assessment (Signed)
[facility] Assessment Progress Note  This Clinical research associate called to schedule tele assessment with this clinician and called EDP Kohut for clinical information.  ED to call TTS and reschedule a tele assessment once pt seen by EDP per EDP Kohut @ 1115, when this clinician called to gather clinical information on the pt.  Casimer Lanius, MS, [facility] Licensed Professional Counselor Therapeutic Triage Specialist [facility] Phone: [phone] Fax: [phone]

## 2014-07-30 NOTE — Discharge Instructions (Signed)
°Depression °Depression refers to feeling sad, low, down in the dumps, blue, gloomy, or empty. In general, there are two kinds of depression: °1. Normal sadness or normal grief. This kind of depression is one that we all feel from time to time after upsetting life experiences, such as the loss of a job or the ending of a relationship. This kind of depression is considered normal, is short lived, and resolves within a few days to 2 weeks. Depression experienced after the loss of a loved one (bereavement) often lasts longer than 2 weeks but normally gets better with time. °2. Clinical depression. This kind of depression lasts longer than normal sadness or normal grief or interferes with your ability to function at home, at work, and in school. It also interferes with your personal relationships. It affects almost every aspect of your life. Clinical depression is an illness. °Symptoms of depression can also be caused by conditions other than those mentioned above, such as: °· Physical illness. Some physical illnesses, including underactive thyroid gland (hypothyroidism), severe anemia, specific types of cancer, diabetes, uncontrolled seizures, heart and lung problems, strokes, and chronic pain are commonly associated with symptoms of depression. °· Side effects of some prescription medicine. In some people, certain types of medicine can cause symptoms of depression. °· Substance abuse. Abuse of alcohol and illicit drugs can cause symptoms of depression. °SYMPTOMS °Symptoms of normal sadness and normal grief include the following: °· Feeling sad or crying for short periods of time. °· Not caring about anything (apathy). °· Difficulty sleeping or sleeping too much. °· No longer able to enjoy the things you used to enjoy. °· Desire to be by oneself all the time (social isolation). °· Lack of energy or motivation. °· Difficulty concentrating or remembering. °· Change in appetite or weight. °· Restlessness or  agitation. °Symptoms of clinical depression include the same symptoms of normal sadness or normal grief and also the following symptoms: °· Feeling sad or crying all the time. °· Feelings of guilt or worthlessness. °· Feelings of hopelessness or helplessness. °· Thoughts of suicide or the desire to harm yourself (suicidal ideation). °· Loss of touch with reality (psychotic symptoms). Seeing or hearing things that are not real (hallucinations) or having false beliefs about your life or the people around you (delusions and paranoia). °DIAGNOSIS  °The diagnosis of clinical depression is usually based on how bad the symptoms are and how long they have lasted. Your health care provider will also ask you questions about your medical history and substance use to find out if physical illness, use of prescription medicine, or substance abuse is causing your depression. Your health care provider may also order blood tests. °TREATMENT  °Often, normal sadness and normal grief do not require treatment. However, sometimes antidepressant medicine is given for bereavement to ease the depressive symptoms until they resolve. °The treatment for clinical depression depends on how bad the symptoms are but often includes antidepressant medicine, counseling with a mental health professional, or both. Your health care provider will help to determine what treatment is best for you. °Depression caused by physical illness usually goes away with appropriate medical treatment of the illness. If prescription medicine is causing depression, talk with your health care provider about stopping the medicine, decreasing the dose, or changing to another medicine. °Depression caused by the abuse of alcohol or illicit drugs goes away when you stop using these substances. Some adults need professional help in order to stop drinking or using drugs. °SEEK IMMEDIATE MEDICAL   CARE IF: °· You have thoughts about hurting yourself or others. °· You lose touch  with reality (have psychotic symptoms). °· You are taking medicine for depression and have a serious side effect. °FOR MORE INFORMATION °· National Alliance on Mental Illness: www.nami.org  °· National Institute of Mental Health: www.nimh.nih.gov  °Document Released: 07/10/2000 Document Revised: 11/27/2013 Document Reviewed: 10/12/2011 °ExitCare® Patient Information ©2015 [employer]. This information is not intended to replace advice given to you by your health care provider. Make sure you discuss any questions you have with your health care provider. °  Emergency Department Resource Guide °1) Find a Doctor and Pay Out of Pocket °Although you won't have to find out who is covered by your insurance plan, it is a good idea to ask around and get recommendations. You will then need to call the office and see if the doctor you have chosen will accept you as a new patient and what types of options they offer for patients who are self-pay. Some doctors offer discounts or will set up payment plans for their patients who do not have insurance, but you will need to ask so you aren't surprised when you get to your appointment. ° °2) Contact Your Local Health Department °Not all health departments have doctors that can see patients for sick visits, but many do, so it is worth a call to see if yours does. If you don't know where your local health department is, you can check in your phone book. The CDC also has a tool to help you locate your state's health department, and many state websites also have listings of all of their local health departments. ° °3) Find a Walk-in Clinic °If your illness is not likely to be very severe or complicated, you may want to try a walk in clinic. These are popping up all over the country in pharmacies, drugstores, and shopping centers. They're usually staffed by nurse practitioners or physician assistants that have been trained to treat common illnesses and complaints. They're usually fairly  quick and inexpensive. However, if you have serious medical issues or chronic medical problems, these are probably not your best option. ° °No Primary Care Doctor: °- Call [facility] at  832-8000 - they can help you locate a primary care doctor that  accepts your insurance, provides certain services, etc. °- Physician Referral Service- 1-800-533-3463 ° °Chronic Pain Problems: °Organization         Address  Phone   Notes  °[facility]  (336) 297-2271 Patients need to be referred by their primary care doctor.  ° °Medication Assistance: °Organization         Address  Phone   Notes  °[place] [facility] [address] °[place], [place] [address] (336) 641-8030 --Must be a resident of [place] °-- Must have NO insurance coverage whatsoever (no Medicaid/ Medicare, etc.) °-- The pt. MUST have a primary care doctor that directs their care regularly and follows them in the community °  °[facility]  (866) 331-1348   °[employer]  (888) 892-1162   ° °Agencies that provide inexpensive medical care: °Organization         Address  Phone   Notes  °[facility]  (336) 832-8035   °[facility]    (336) 832-7272   °[facility] [address] °[place], [place] [address] (336) 832-4777   °[facility] [address], °[place] (336) 271-4999   °  [facility]    (336) 373-0678   °[facility]    (336) 272-1050   °[facility] ° [address], [place] Phone:  (336) 832-4444, Fax:  (336) 832-4440 Hours of Operation:  9 am - 6 pm, M-F.  Also accepts Medicaid/Medicare and self-pay.  °[facility] ° [address], [place] Phone: (336) 832-3150, Fax: (336) 832-3151. Hours of Operation:  8:30 am - 5:30 pm, M-F.  Also accepts Medicaid and self-pay.  °[facility] [place] [address], [place] Phone: (336) 878-6027    °[facility] [address], [place], [place] (336)723-1848, Ext. 123 Mondays & Thursdays: 7-9 AM.  First 15 patients are seen on a first come, first serve basis. °  ° °Medicaid-accepting [place] Providers: ° °Organization         Address  Phone   Notes  °[facility] [address], [place] (336) 641-2100 Also accepts self-pay patients.  °[facility] [address], [place] ° (336) 856-9996   °[facility] [address], [place] (336) 288-8857   °[facility] [address] [place] [address], [place] (336) 299-7000   °Veita Bland [address], [place]  ° (336) 373-1557 Only accepts [place] Access Medicaid patients after they have their name applied to their card.  ° °Self-Pay (no insurance) in [place]: ° °Organization         Address  Phone   Notes  °Sickle Cell Patients, [facility] [address], [place] (336) 832-1970   °[facility] [address], [place] (336) 832-4400   ° [facility] ° [address], [place] (336) 992-4800   °[facility]/Dr. Osei-Bonsu ° [address] [place] [address], [place] or [address], [place] (336) 841-8500 Phone number for both [place] and [place] locations is the same.  °[facility] [address], [place] (336) 299-0000   °[facility] [address] [place] [address], [place] or [address] (336) 852-7530 °(336) 878-2260   °[facility] [address], [place] (336) 350-1642, phone; (336) 294-5005, fax Sees patients 1st and 3rd Saturday of every month.  Must not qualify for public or private insurance (i.e. Medicaid, Medicare, [place] Health Choice, Veterans' Benefits) • Household income should be no more than 200% of the poverty level •The clinic cannot treat you if you are  pregnant or think you are pregnant • Sexually transmitted diseases are not treated at the clinic.  ° °Dental Care: °Organization         Address  Phone  Notes  °[facility] [address], [place] (336) 641-6152 Accepts children up to age 21 who are enrolled in Medicaid or [place] Health Choice; pregnant women with a Medicaid card; and children who have applied for Medicaid or [place] Health Choice, but were declined, whose parents can pay a reduced fee at time of service.  °[facility] [place]  [address], [place] (336) 641-7733 Accepts children up to age 21 who are enrolled in Medicaid or [place] Health Choice; pregnant women with a Medicaid card; and children who have applied for Medicaid or [place] Health Choice, but were declined, whose   parents can pay a reduced fee at time of service.  °[facility] ° [address], [place] (336) 641-4533 Patients are seen by appointment only. Walk-ins are not accepted. [facility] will see patients 18 years of age and older. °Monday - Tuesday (8am-5pm) °Most Wednesdays (8:30-5pm) °$30 per visit, cash only  °[facility] ° [address], [place] (336) 641-4533 Patients are seen by appointment only. Walk-ins are not accepted. [facility] will see patients 18 years of age and older. °One Wednesday Evening (Monthly: Volunteer Based).  $30 per visit, cash only  °[facility]  (919) 537-3737 for adults; Children under age 4, call Graduate Pediatric Dentistry at (919) [phone]. Children aged 4-14, please call (919) [phone] to request a pediatric application. ° Dental services are provided in all areas of dental care including fillings, crowns and bridges, complete and partial dentures, implants, gum treatment, root canals, and extractions. Preventive care is also provided. Treatment is provided to  both adults and children. °Patients are selected via a lottery and there is often a waiting list. °  °[facility] [address], °[place] ° (336) 763-8833 www.drcivils.com °  °[facility] [address], [place], [place] (336)723-1848, Ext. 123 Second and Fourth Thursday of each month, opens at 6:30 AM; Clinic ends at 9 AM.  Patients are seen on a first-come first-served basis, and a limited number are seen during each clinic.  ° °[facility] ° [address], [place], [place] (336) 723-7904   Eligibility Requirements °You must have lived in [place], [place], or [place] counties for at least the last three months. °  You cannot be eligible for state or federal sponsored healthcare insurance, including Veterans Administration, Medicaid, or Medicare. °  You generally cannot be eligible for healthcare insurance through your employer.  °  How to apply: °Eligibility screenings are held every Tuesday and Wednesday afternoon from 1:00 pm until 4:00 pm. You do not need an appointment for the interview!  °[facility] [address], [place], [place] 336-631-2330   °[facility]  336-342-8273   °[facility]  336-703-3100   °[facility]  336-570-6415   ° °Behavioral Health Resources in the Community: °Intensive Outpatient Programs °Organization         Address  Phone  Notes  °[facility] [address], [place], [place] 336-878-6098   °[facility] [address], [place], [place] 336-832-9800   °[facility] [address], [place], [place] ° 336-882-2125   °[facility] [address],  °[place], [place] 1-800-853-5163 or 336-641-4981   °Substance Abuse Resources °Organization         Address  Phone  Notes  °[facility]  336-882-2125   °[facility]  336-784-9470   °[facility]   336-285-9073   °[facility]  336-845-3988   °Residential & Outpatient Substance Abuse Program  1-800-659-3381   °Psychological Services °Organization         Address  Phone  Notes  °[facility]  336- 832-9600   °[facility]  336- 378-7881   °[facility] [address], [place] 1-800-853-5163 or 336-641-4981   ° °Mobile Crisis Teams °Organization         Address  Phone  Notes  °[facility], Mobile Crisis   Care Unit  1-877-626-1772  °Assertive °[facility] ° [address] [place], [place] 336-834-9664  °Sharon DeEsch [address] °[place] 336-554-5454  ° °Self-Help/Support Groups °Organization         Address  Phone             Notes  °[facility] [place] - variety of support groups  336- 373-1402 Call for more information  °Narcotics Anonymous (NA), [facility] [address], °[place]  2 meetings at this location  ° °Residential Treatment Programs °Organization         Address  Phone  Notes  °[facility] [address],    °[place]  1-866-801-8205   °[facility] ° [address], [place], [place] 704-293-8524   °[facility] [address], [place] 336-845-3988 Admissions: 8am-3pm M-F  °[facility] [address]    °[place], [place] 336-841-1104   °[facility] [address], [place], [place] 336-379-7146   °[facility] [address]  °[place], [place] 336-285-9073   °[facility] Intensive Outpatient [address], [place], [place] 336-852-3033   °[facility] [address]  °[place], [place] 1-877-615-2722 or 336-784-9470   °[facility] [address] [place], [place] 336-227-7417 Accepts Medicaid  °[facility] [address]  ° [place] 1-800-659-3381 Substance Abuse/Addiction Treatment  ° °[place] Behavioral Health Resources °Organization         Address  Phone  Notes  °[facility]  (888) 581-9988   °Julie Brannon, PhD [address] [place], [place]   (336) 349-5553 or (336) 951-0000   °[facility]   [address] °[place], [place] (336) 349-4454   °[facility] [address], [place], [place] (336) 342-8316 Insurance/Medicaid/sponsorship through [facility]  °[facility] [address]                                    [place], [place] (336) 342-8316 Therapy/tele-psych/case  °[facility] [address]  ° [place], [place] (336) 349-2233    °Dr. Arfeen  (336) 349-4544   °[facility] [place]  [employer] [facility] [place] [facility] 1) [address], [place] °2) [address], [place] °3)  [address], [place] (336) 349-3220 °(336) 342-7768 ° °(336) 342-8140   °[place] [facility] (336) 342-1394 or (336) 342-3537 (After Hours)    ° °   °

## 2014-07-30 NOTE — ED Notes (Signed)
Patient states he is having problems with his emotions-has a lot of regret-wants to feel normal again

## 2014-08-04 NOTE — ED Provider Notes (Signed)
CSN: [id number]     Arrival date & time 07/30/14  1049 History   First MD Initiated Contact with Patient 07/30/14 1115     Chief Complaint  Patient presents with  . Depression     (Consider location/radiation/quality/duration/timing/severity/associated sxs/prior Treatment) HPI   29 year old male presenting with his sister for evaluation of anxiety and depression. Long-standing history of the same. [place] have local psychiatric care. He denies any suicidal/homicidal ideation. No hallucinations. Denies any drug use. No specific acute stressors.  Past Medical History  Diagnosis Date  . Hypertension   . Heart disease   . Diabetes mellitus without complication   . Asperger's syndrome    Past Surgical History  Procedure Laterality Date  . Cholecystectomy     Family History  Problem Relation Age of Onset  . Arthritis Mother   . Diabetes Father   . Diabetes Brother    History  Substance Use Topics  . Smoking status: Former Games developermoker  . Smokeless tobacco: Not on file  . Alcohol Use: No    Review of Systems  All systems reviewed and negative, other than as noted in HPI.   Allergies  Review of patient's allergies indicates no known allergies.  Home Medications   Prior to Admission medications   Medication Sig Start Date End Date Taking? Authorizing Provider  aspirin EC 325 MG tablet Take 325 mg by mouth daily.   Yes Historical Provider, MD  cetirizine (ZYRTEC) 10 MG tablet Take 10 mg by mouth daily.   Yes Historical Provider, MD  colestipol (COLESTID) 1 G tablet Take 1 g by mouth 3 (three) times daily.   Yes Historical Provider, MD  ezetimibe (ZETIA) 10 MG tablet Take 10 mg by mouth daily.   Yes Historical Provider, MD  glucose blood test strip Use as instructed 07/11/14  Yes Ambrose FinlandValerie A Keck, NP  ibuprofen (ADVIL,MOTRIN) 200 MG tablet Take 400 mg by mouth every 6 (six) hours as needed for moderate pain (headache).    Yes Historical  Provider, MD  insulin glargine (LANTUS) 100 UNIT/ML injection Inject 0.7 mLs (70 Units total) into the skin 2 (two) times daily. E11.65 07/11/14  Yes Ambrose FinlandValerie A Keck, NP  insulin lispro (HUMALOG) 100 UNIT/ML injection Inject 0.02-0.06 mLs (2-6 Units total) into the skin 3 (three) times daily with meals. E11.65 Patient taking differently: Inject 2-6 Units into the skin 3 (three) times daily with meals. E11.65. Sliding scale: 120-150= 1 unit; 151-200= 2 units; 201-250= 4 units; 251-300= 6 units; 301-350= 8 units; >350 call PCP 07/11/14  Yes Ambrose FinlandValerie A Keck, NP  lisinopril (PRINIVIL,ZESTRIL) 40 MG tablet Take 40 mg by mouth daily.   Yes Historical Provider, MD  Melatonin 3 MG CAPS Take 1 capsule by mouth at bedtime.    Yes Historical Provider, MD  traMADol (ULTRAM) 50 MG tablet Take one every 8 hours for pain not helped by tylenol 07/03/14  Yes Benny LennertJoseph L Zammit, MD   BP 137/92 mmHg  Pulse 101  Temp(Src) 98.1 F (36.7 C) (Oral)  Resp 19  SpO2 100% Physical Exam  Constitutional: He appears well-developed and well-nourished. No distress.  HENT:  Head: Normocephalic and atraumatic.  Eyes: Conjunctivae are normal. Right eye exhibits no discharge. Left eye exhibits no discharge.  Neck: Neck supple.  Cardiovascular: Normal rate, regular rhythm and normal heart sounds.  Exam reveals no gallop and no friction rub.   No murmur heard. Pulmonary/Chest: Effort normal and breath sounds normal. No  respiratory distress.  Abdominal: Soft. He exhibits no distension. There is no tenderness.  Musculoskeletal: He exhibits no edema or tenderness.  Neurological: He is alert.  Skin: Skin is warm and dry.  Psychiatric: He has a normal mood and affect. His behavior is normal. Thought content normal.  Nursing note and vitals reviewed.   ED Course  Procedures (including critical care time) Labs Review Labs Reviewed  BASIC METABOLIC PANEL - Abnormal; Notable for the following:    Glucose, Bld 228 (*)    GFR calc  non Af Amer 90 (*)    All other components within normal limits  CBC WITH DIFFERENTIAL - Abnormal; Notable for the following:    Hemoglobin 12.4 (*)    HCT 37.1 (*)    All other components within normal limits  URINALYSIS, ROUTINE W REFLEX MICROSCOPIC - Abnormal; Notable for the following:    Glucose, UA 100 (*)    Hgb urine dipstick SMALL (*)    Protein, ur >300 (*)    All other components within normal limits  URINE MICROSCOPIC-ADD ON - Abnormal; Notable for the following:    Bacteria, UA FEW (*)    Casts HYALINE CASTS (*)    All other components within normal limits  URINE RAPID DRUG SCREEN (HOSP PERFORMED)    Imaging Review No results found.   EKG Interpretation None      MDM   Final diagnoses:  Anxiety  Depression    29 year old male with anxiety depression. He is not suicidal/homicidal. Not psychotic. New to this area is unfamiliar with local resources. Provided for him. Return precautions discussed with sister.    Raeford Razor, MD 08/04/14 [phone]

## 2014-08-21 ENCOUNTER — Telehealth: Payer: Self-pay | Admitting: *Deleted

## 2014-08-21 NOTE — Telephone Encounter (Signed)
Unable to LVM, voice mail not set up

## 2014-08-21 NOTE — Telephone Encounter (Signed)
-----   Message from Ambrose FinlandValerie A Keck, NP sent at 08/20/2014 11:57 AM EST ----- Find out how long patient has been on colestid and Zetia combo. If patient has been on medication for a while I will switch him to something different. Find out what medications he has been on in the past for his high triglycerides/cholesterol. You may have to speak to his cousin who is his caregiver. Thanks.

## 2014-09-25 ENCOUNTER — Encounter (HOSPITAL_COMMUNITY): Payer: Self-pay | Admitting: Emergency Medicine

## 2014-09-25 ENCOUNTER — Emergency Department (HOSPITAL_COMMUNITY)
Admission: EM | Admit: 2014-09-25 | Discharge: 2014-09-25 | Disposition: A | Discharge status: Home / Self Care | Payer: Medicaid Other | Attending: Emergency Medicine | Admitting: Emergency Medicine

## 2014-09-25 DIAGNOSIS — Z79899 Other long term (current) drug therapy: Secondary | ICD-10-CM | POA: Diagnosis not present

## 2014-09-25 DIAGNOSIS — Z8659 Personal history of other mental and behavioral disorders: Secondary | ICD-10-CM | POA: Diagnosis not present

## 2014-09-25 DIAGNOSIS — R454 Irritability and anger: Secondary | ICD-10-CM | POA: Diagnosis present

## 2014-09-25 DIAGNOSIS — G4700 Insomnia, unspecified: Secondary | ICD-10-CM | POA: Diagnosis not present

## 2014-09-25 DIAGNOSIS — Z87891 Personal history of nicotine dependence: Secondary | ICD-10-CM | POA: Insufficient documentation

## 2014-09-25 DIAGNOSIS — E119 Type 2 diabetes mellitus without complications: Secondary | ICD-10-CM | POA: Diagnosis not present

## 2014-09-25 DIAGNOSIS — I10 Essential (primary) hypertension: Secondary | ICD-10-CM | POA: Diagnosis not present

## 2014-09-25 DIAGNOSIS — Z794 Long term (current) use of insulin: Secondary | ICD-10-CM | POA: Diagnosis not present

## 2014-09-25 DIAGNOSIS — Z7982 Long term (current) use of aspirin: Secondary | ICD-10-CM | POA: Insufficient documentation

## 2014-09-25 NOTE — Discharge Instructions (Signed)
Aggression °Physically aggressive behavior is common among small children. When frustrated or angry, toddlers may act out. Often, they will push, bite, or hit. Most children show less physical aggression as they grow up. Their language and interpersonal skills improve, too. But continued aggressive behavior is a sign of a problem. This behavior can lead to aggression and delinquency in adolescence and adulthood. °Aggressive behavior can be psychological or physical. Forms of psychological aggression include threatening or bullying others. Forms of physical aggression include:  °· Pushing. °· Hitting. °· Slapping. °· Kicking. °· Stabbing. °· Shooting. °· Raping.  °PREVENTION  °Encouraging the following behaviors can help manage aggression: °· Respecting others and valuing differences. °· Participating in school and community functions, including sports, music, after-school programs, community groups, and volunteer work. °· Talking with an adult when they are sad, depressed, fearful, anxious, or angry. Discussions with a parent or other family member, counselor, teacher, or coach can help. °· Avoiding alcohol and drug use. °· Dealing with disagreements without aggression, such as conflict resolution. To learn this, children need parents and caregivers to model respectful communication and problem solving. °· Limiting exposure to aggression and violence, such as video games that are not age appropriate, violence in the media, or domestic violence. °Document Released: 05/10/2007 Document Revised: 10/05/2011 Document Reviewed: 09/18/2010 °[employer]® Patient Information ©2015 [employer]. This information is not intended to replace advice given to you by your health care provider. Make sure you discuss any questions you have with your health care provider. ° °

## 2014-09-25 NOTE — ED Provider Notes (Signed)
CSN: [id number]     Arrival date & time 09/25/14  1705 History   First MD Initiated Contact with Patient 09/25/14 1934     Chief Complaint  Patient presents with  . anger outburst   . Depression     (Consider location/radiation/quality/duration/timing/severity/associated sxs/prior Treatment) Patient is a 29 y.o. male presenting with mental health disorder.  Mental Health Problem Presenting symptoms: agitation   Patient accompanied by:  Family member Degree of incapacity (severity):  Severe Onset quality:  Gradual Duration: chronically for years. Timing:  Constant Progression:  Unchanged Chronicity:  New Context: stressful life event (recent court charges)   Context: not noncompliant   Treatment compliance:  All of the time Relieved by:  Nothing Worsened by:  Nothing tried Associated symptoms: insomnia, irritability and poor judgment   Associated symptoms: no abdominal pain     Past Medical History  Diagnosis Date  . Hypertension   . Heart disease   . Diabetes mellitus without complication   . Asperger's syndrome    Past Surgical History  Procedure Laterality Date  . Cholecystectomy     Family History  Problem Relation Age of Onset  . Arthritis Mother   . Diabetes Father   . Diabetes Brother    History  Substance Use Topics  . Smoking status: Former Games developermoker  . Smokeless tobacco: Not on file  . Alcohol Use: No    Review of Systems  Constitutional: Positive for irritability.  Gastrointestinal: Negative for abdominal pain.  Psychiatric/Behavioral: Positive for agitation. The patient has insomnia.   All other systems reviewed and are negative.     Allergies  Review of patient's allergies indicates no known allergies.  Home Medications   Prior to Admission medications   Medication Sig Start Date End Date Taking? Authorizing Provider  aspirin EC 325 MG tablet Take 325 mg by mouth daily.   Yes Historical Provider, MD  cetirizine (ZYRTEC) 10 MG tablet Take  10 mg by mouth daily.   Yes Historical Provider, MD  colestipol (COLESTID) 1 G tablet Take 1 g by mouth 3 (three) times daily.   Yes Historical Provider, MD  ezetimibe (ZETIA) 10 MG tablet Take 10 mg by mouth daily.   Yes Historical Provider, MD  ibuprofen (ADVIL,MOTRIN) 200 MG tablet Take 400 mg by mouth every 6 (six) hours as needed for moderate pain (headache).    Yes Historical Provider, MD  insulin glargine (LANTUS) 100 UNIT/ML injection Inject 0.7 mLs (70 Units total) into the skin 2 (two) times daily. E11.65 07/11/14  Yes Ambrose FinlandValerie A Keck, NP  insulin lispro (HUMALOG) 100 UNIT/ML injection Inject 0.02-0.06 mLs (2-6 Units total) into the skin 3 (three) times daily with meals. E11.65 Patient taking differently: Inject 2-6 Units into the skin 3 (three) times daily with meals. E11.65. Sliding scale: 120-150= 1 unit; 151-200= 2 units; 201-250= 4 units; 251-300= 6 units; 301-350= 8 units; >350 call PCP 07/11/14  Yes Ambrose FinlandValerie A Keck, NP  lisinopril (PRINIVIL,ZESTRIL) 40 MG tablet Take 40 mg by mouth daily.   Yes Historical Provider, MD  Melatonin 3 MG CAPS Take 1 capsule by mouth at bedtime.    Yes Historical Provider, MD  glucose blood test strip Use as instructed Patient not taking: Reported on 09/25/2014 07/11/14   Ambrose FinlandValerie A Keck, NP  traMADol Janean Sark(ULTRAM) 50 MG tablet Take one every 8 hours for pain not helped by tylenol Patient not taking: Reported on 09/25/2014 07/03/14   Benny LennertJoseph L Zammit, MD   BP 187/130 mmHg  Pulse  123  Temp(Src) 98.1 F (36.7 C) (Oral)  Resp 22  SpO2 98% Physical Exam  Constitutional: He is oriented to person, place, and time. He appears well-developed and well-nourished.  HENT:  Head: Normocephalic and atraumatic.  Eyes: Conjunctivae and EOM are normal.  Neck: Normal range of motion. Neck supple.  Cardiovascular: Normal rate, regular rhythm and normal heart sounds.   Pulmonary/Chest: Effort normal and breath sounds normal. No respiratory distress.  Abdominal: He exhibits no  distension. There is no tenderness. There is no rebound and no guarding.  Musculoskeletal: Normal range of motion.  Neurological: He is alert and oriented to person, place, and time.  Skin: Skin is warm and dry.  Vitals reviewed.   ED Course  Procedures (including critical care time) Labs Review Labs Reviewed - No data to display  Imaging Review No results found.   EKG Interpretation None      MDM   Final diagnoses:  Outbursts of anger    29 y.o. male with pertinent PMH of asperger's, HTN presents with chronic aggression towards self with hitting himself in the head.  Physical exam today as above, pt did not hit himself in the head, and his HR was normal on my examination.  No recent illnesses.  Spoke to the patient and his family at length regarding whether or not there were acute changes which precipitated the visit today, it seems that they are here because they recently moved to the area and are frustrated rather than any acute worsening of his symptoms. The patient specifically denies suicidal or homicidal ideations and denies any hallucinations.  I verified that there was no concern for the patient's safety at home apart from his 22-19 year old baseline.  His mother was visibly upset, I was unable to console her.  I discussed the utility of psych evaluation in the ED at length with both parents and the pt, however did not feel that this was warranted given that the pt has no acute change, is not suicidal or homicidal, and contracts repeatedly for his own safety at home.  His father was understanding and agreed with plan to dc home.  He has extensive home fu for his condition.    I have reviewed all laboratory and imaging studies if ordered as above  1. Outbursts of anger         Mirian Mo, MD 09/25/14 [phone]

## 2014-09-25 NOTE — ED Notes (Addendum)
Pt comes in stating that he has been accused of a crime he hasnt committed and that burden has effective him and his family. States that his family had to relocate.  Pt states that he only has negativity, repeatedly states that he wants to die and has nothing to live for.  Mother is upset bc she has been trying to get him help for 27 years and the mental health system has failed them.  Pt beats himself in his outbursts.  Pt denies SI or plan. Pt's mother states that she has to tell him everything to do bc he "cant function anymore" , from putting on shoes, what to do in the shower, to when to go to bed.

## 2015-08-15 IMAGING — CR DG CHEST 2V
2 series · 2 of 2 positions shown · non-contrast
Comparison: 06/22/2014

CLINICAL DATA: Chest pain.

EXAM:
CHEST  2 VIEW

[w chest pa]
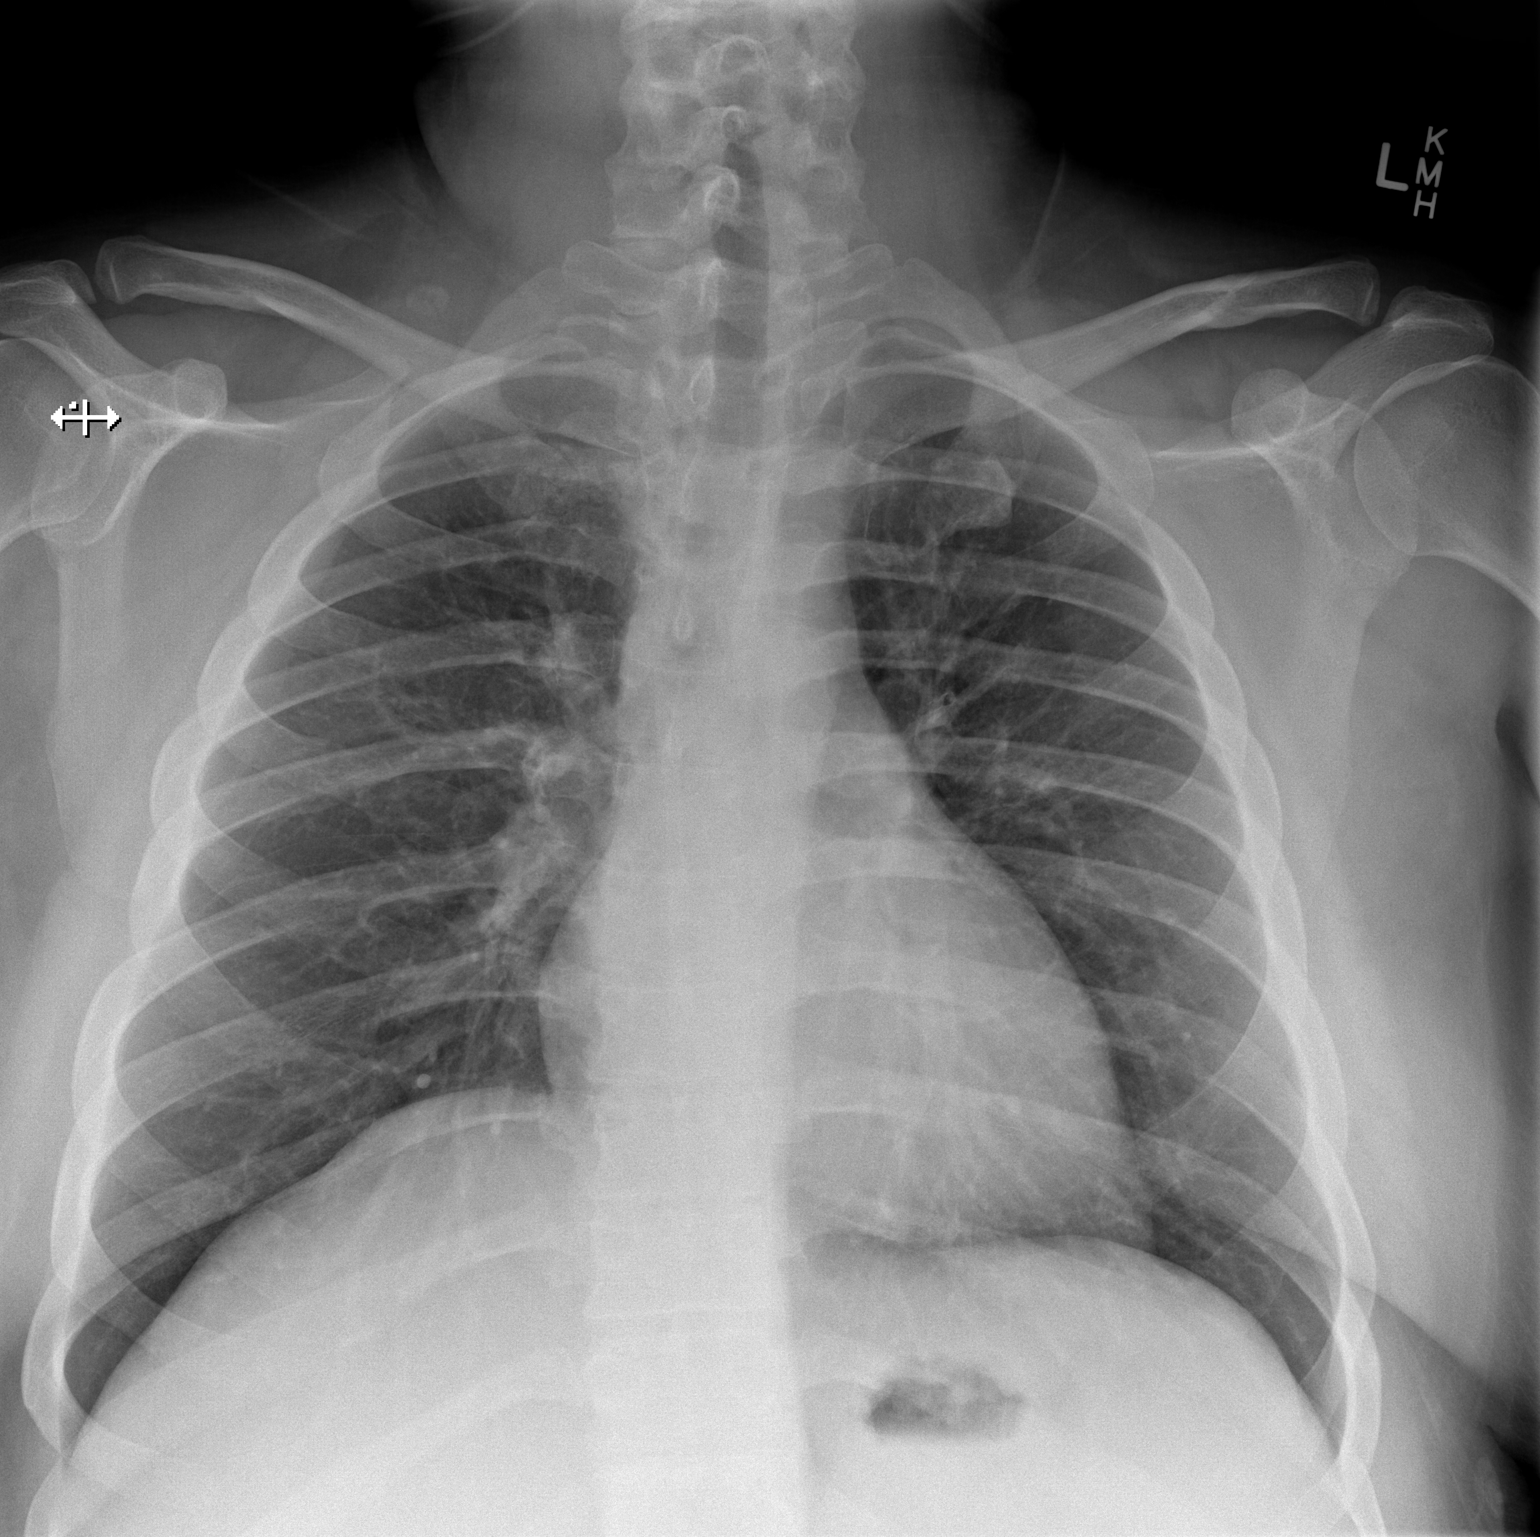

[w chest lat]
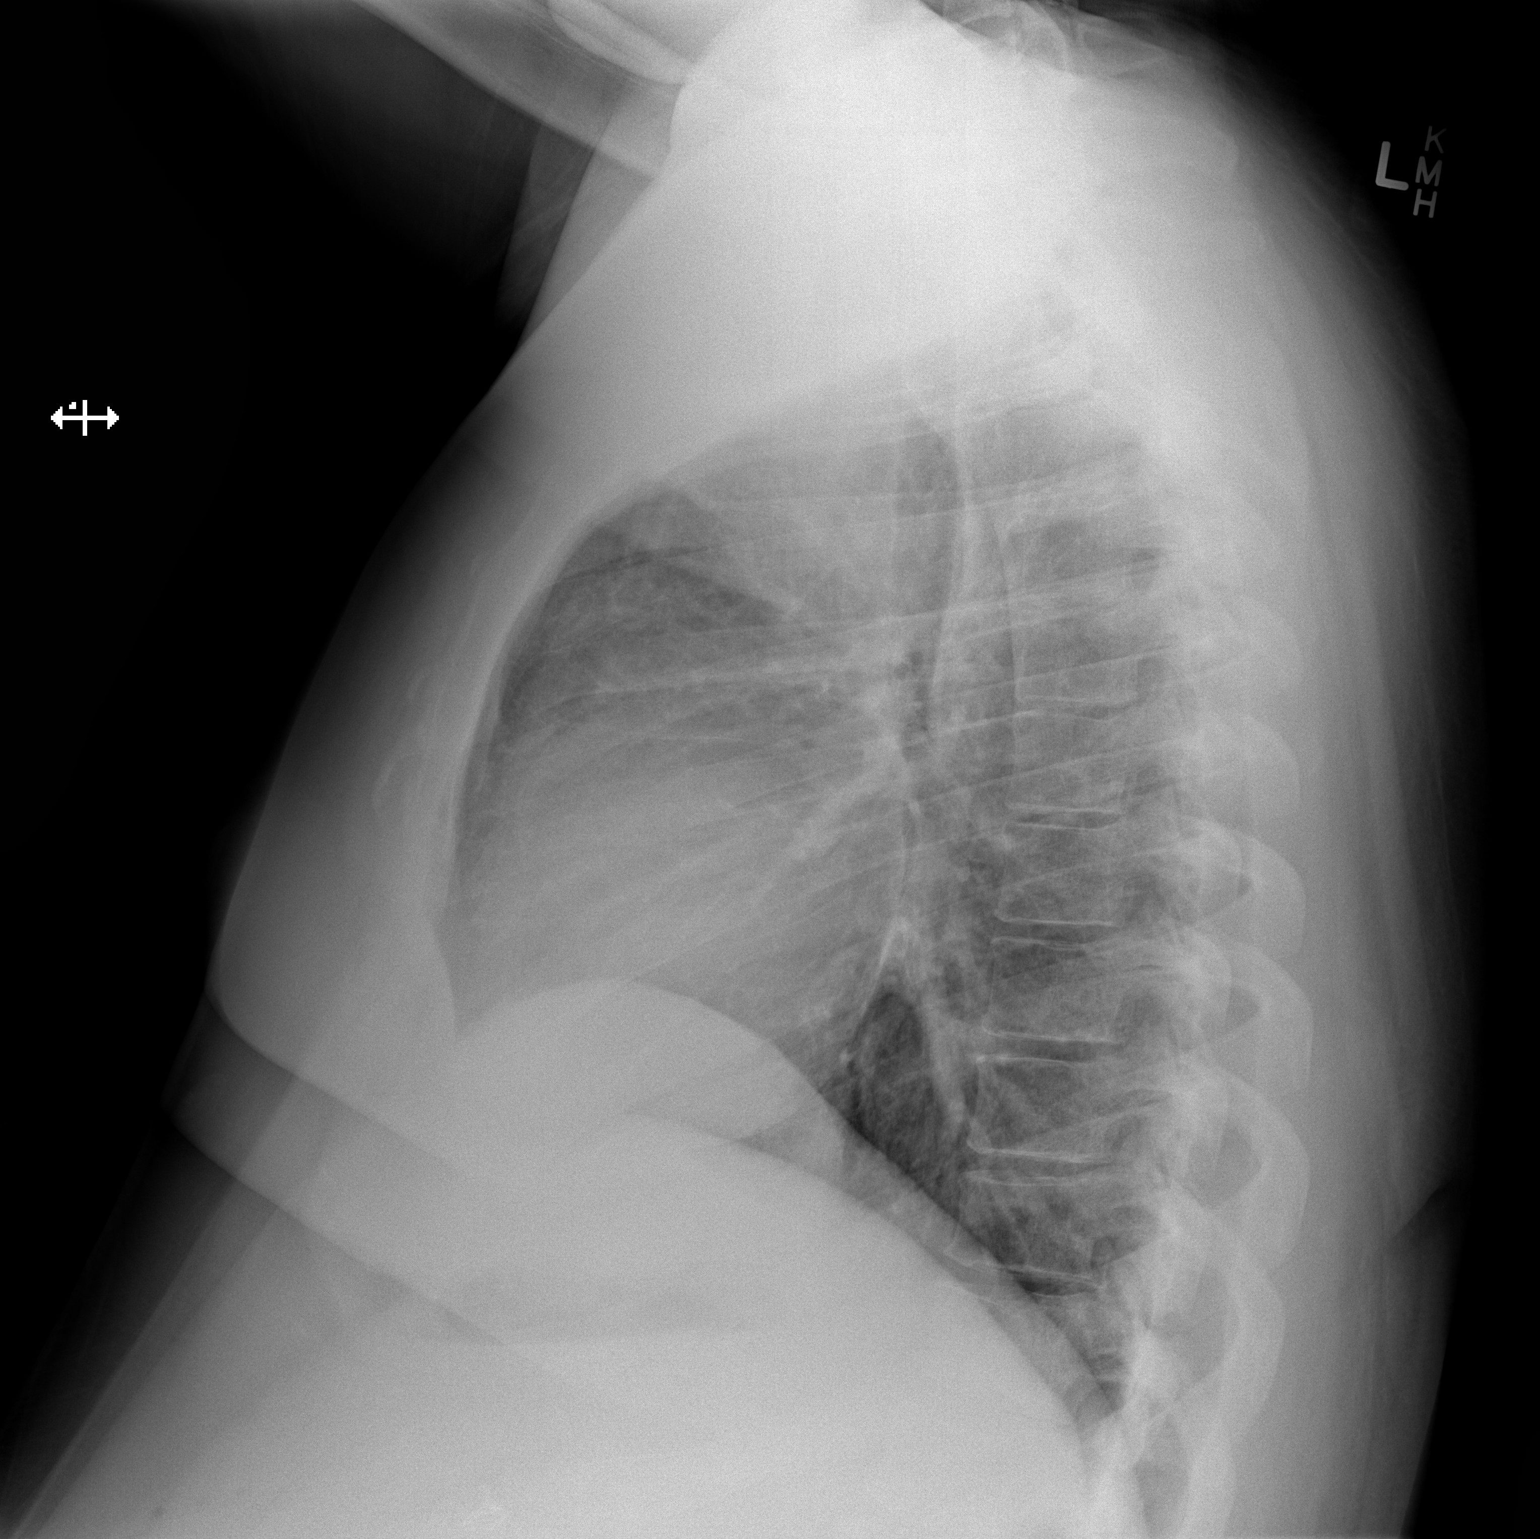

[2 of 2 positions shown; findings below may reference images not displayed]

FINDINGS: The heart size and mediastinal contours are within normal limits.
Both lungs are clear. The visualized skeletal structures are
unremarkable.
IMPRESSION: No active cardiopulmonary disease.

## 2017-02-10 ENCOUNTER — Telehealth: Payer: Self-pay | Admitting: General Practice

## 2017-02-10 NOTE — Telephone Encounter (Signed)
[facility] called to get a one time auth for Medicaid

## 2018-03-27 DEATH — deceased
